# Patient Record
Sex: Male | Born: 1980 | Race: White | Hispanic: No | Marital: Married
Health system: Southern US, Community
[De-identification: ages and names within clinical notes are randomized; demographics above are authoritative.]

---

## 2005-12-27 ENCOUNTER — Emergency Department (HOSPITAL_COMMUNITY): Admission: EM | Admit: 2005-12-27 | Discharge: 2005-12-27 | Payer: Self-pay | Admitting: Family Medicine

## 2010-03-13 ENCOUNTER — Emergency Department (HOSPITAL_COMMUNITY): Admission: EM | Admit: 2010-03-13 | Discharge: 2010-03-13 | Payer: Self-pay | Admitting: Family Medicine

## 2015-10-22 ENCOUNTER — Other Ambulatory Visit: Payer: Self-pay | Admitting: Otolaryngology

## 2015-10-22 DIAGNOSIS — H903 Sensorineural hearing loss, bilateral: Secondary | ICD-10-CM

## 2015-10-22 DIAGNOSIS — H9312 Tinnitus, left ear: Secondary | ICD-10-CM

## 2015-11-01 ENCOUNTER — Ambulatory Visit
Admission: RE | Admit: 2015-11-01 | Discharge: 2015-11-01 | Disposition: A | Discharge status: Home / Self Care | Payer: BLUE CROSS/BLUE SHIELD | Source: Ambulatory Visit | Attending: Otolaryngology | Admitting: Otolaryngology

## 2015-11-01 DIAGNOSIS — H903 Sensorineural hearing loss, bilateral: Secondary | ICD-10-CM

## 2015-11-01 DIAGNOSIS — H9312 Tinnitus, left ear: Secondary | ICD-10-CM

## 2015-11-01 MED ORDER — GADOBENATE DIMEGLUMINE 529 MG/ML IV SOLN: 15.0000 mL | Freq: Once | INTRAVENOUS | Status: DC | PRN

## 2016-04-25 DIAGNOSIS — M25511 Pain in right shoulder: Secondary | ICD-10-CM | POA: Diagnosis not present

## 2016-04-25 DIAGNOSIS — M5412 Radiculopathy, cervical region: Secondary | ICD-10-CM | POA: Diagnosis not present

## 2016-05-03 DIAGNOSIS — M25511 Pain in right shoulder: Secondary | ICD-10-CM | POA: Diagnosis not present

## 2016-05-03 DIAGNOSIS — M5412 Radiculopathy, cervical region: Secondary | ICD-10-CM | POA: Diagnosis not present

## 2016-06-30 DIAGNOSIS — Z23 Encounter for immunization: Secondary | ICD-10-CM | POA: Diagnosis not present

## 2016-07-13 DIAGNOSIS — Z3009 Encounter for other general counseling and advice on contraception: Secondary | ICD-10-CM | POA: Diagnosis not present

## 2016-08-03 DIAGNOSIS — Z302 Encounter for sterilization: Secondary | ICD-10-CM | POA: Diagnosis not present

## 2016-10-06 DIAGNOSIS — Z0000 Encounter for general adult medical examination without abnormal findings: Secondary | ICD-10-CM | POA: Diagnosis not present

## 2016-10-06 DIAGNOSIS — Z13220 Encounter for screening for lipoid disorders: Secondary | ICD-10-CM | POA: Diagnosis not present

## 2017-06-25 DIAGNOSIS — Z23 Encounter for immunization: Secondary | ICD-10-CM | POA: Diagnosis not present

## 2018-02-15 IMAGING — MR MR HEAD WO/W CM
10 of 11 series · 41 of 48 positions shown · IV contrast (multihance)
Comparison: None available.

CLINICAL DATA: Ringing in left ear for 4-6 months. Bilateral
hearing loss.

EXAM:
MRI HEAD WITHOUT AND WITH CONTRAST
TECHNIQUE: Multiplanar, multiecho pulse sequences of the brain and surrounding
structures were obtained without and with intravenous contrast.
CONTRAST:  15 mL MultiHance

[Series 5: T1 · sagittal · 4.0mm · 0.72mm/px · 4 of 26 slices shown (1 of 3)]
[im 1/26]
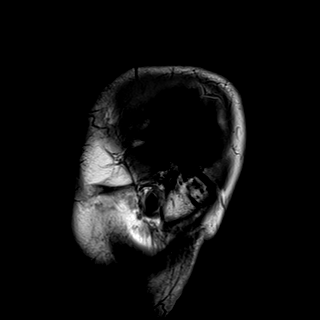
[im 9/26]
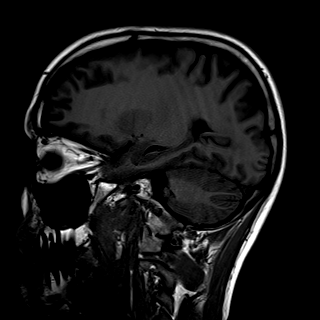
[im 17/26]
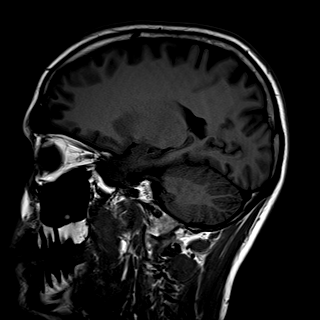
[im 26/26]
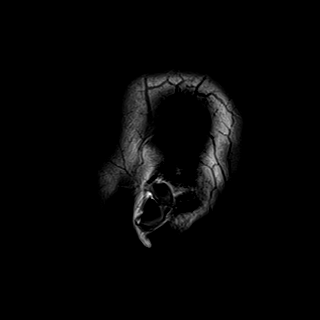

[Series 6: DWI · axial · 3.5mm · 1.44mm/px · z∈[-69,+75]mm · 7 of 66 slices shown (1 of 2)]
[im 1/66]
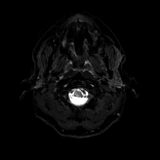
[im 11/66]
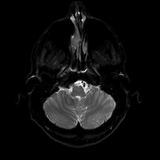
[im 22/66]
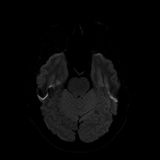
[im 33/66]
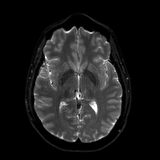
[im 44/66]
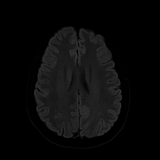
[im 55/66]
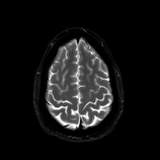
[im 66/66]
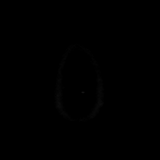

[Series 7: DWI · axial · 3.5mm · 1.44mm/px · z∈[-69,+75]mm · 3 of 32 slices shown (2 of 2)]
[im 1/32]
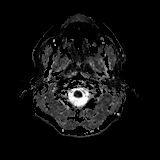
[im 16/32]
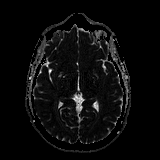
[im 32/32]
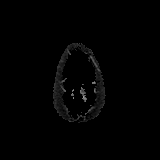

[Series 8: T2 · axial · 4.0mm · 0.36mm/px · z∈[-71,+78]mm · 3 of 30 slices shown]
[im 1/30]
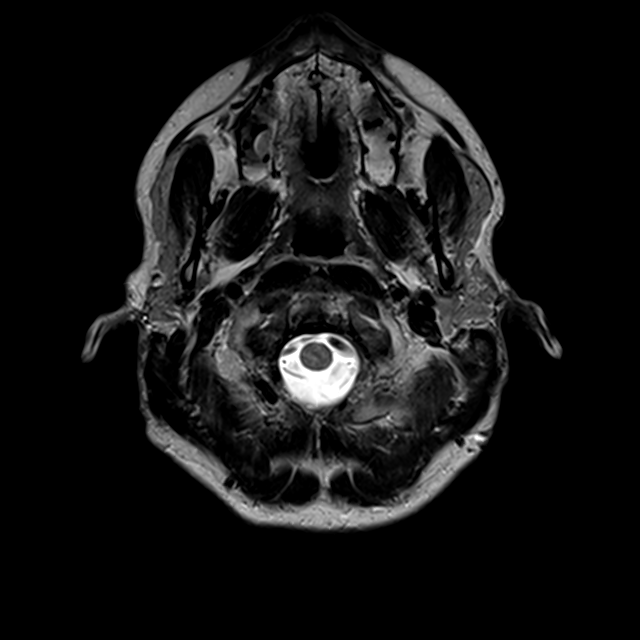
[im 15/30]
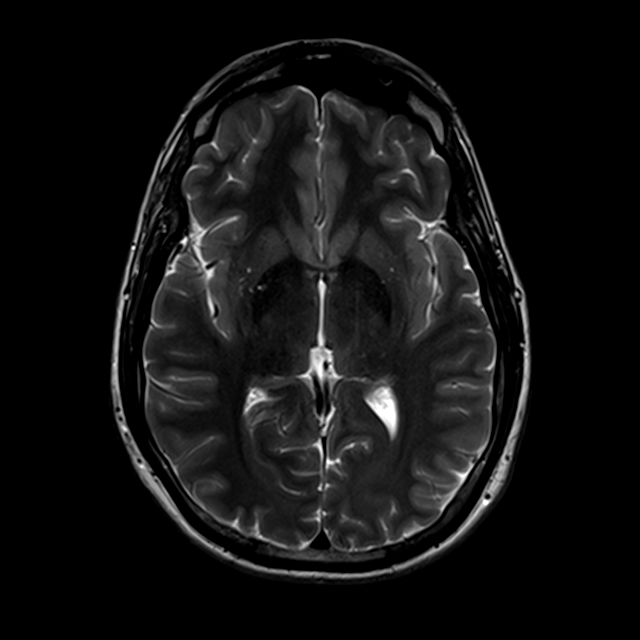
[im 30/30]
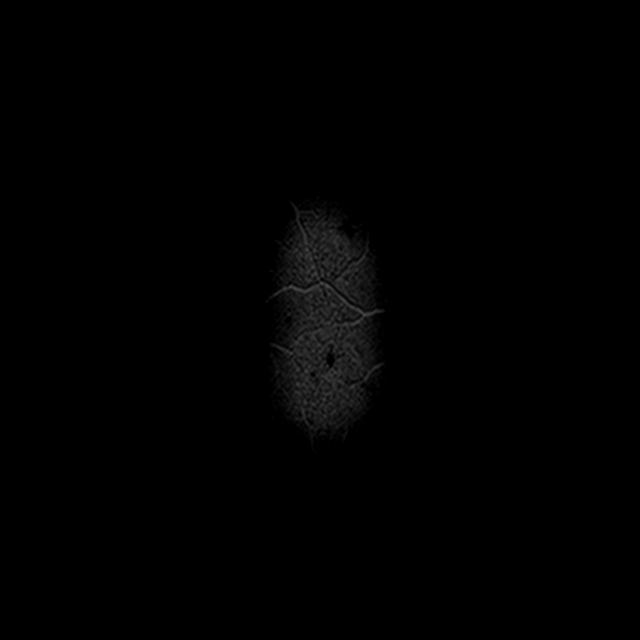

[Series 9: FLAIR · axial · 4.0mm · 0.72mm/px · z∈[-71,+78]mm · 3 of 30 slices shown]
[im 1/30]
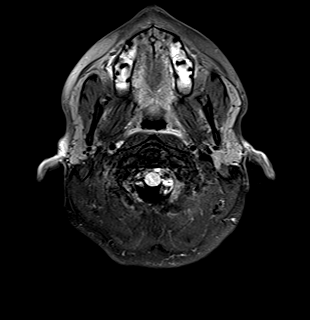
[im 15/30]
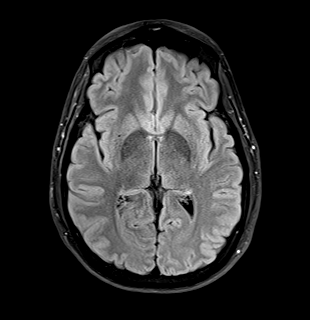
[im 30/30]
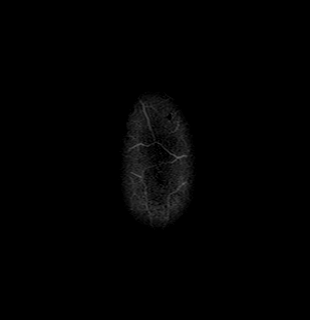

[Series 10: T1 · coronal · 2.5mm · 0.56mm/px · 1 of 11 slices shown (2 of 3)]
[im 1/11]
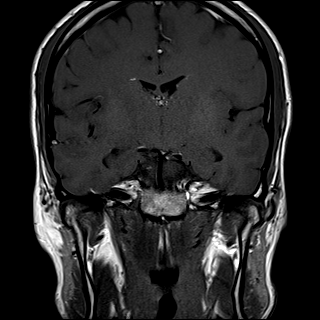

[Series 11: T1 · axial · 2.5mm · 0.59mm/px · 1 of 11 slices shown (3 of 3)]
[im 1/11]
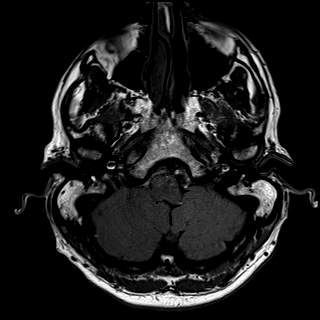

[Series 13: T1 post-contrast · coronal · 2.5mm · 0.56mm/px · 1 of 11 slices shown (1 of 3)]
[im 1/11]
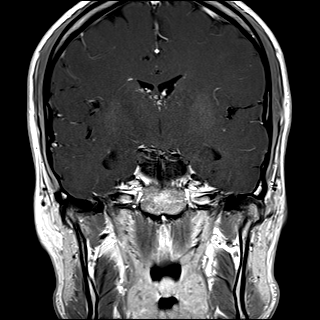

[Series 14: T1 post-contrast · axial · 2.5mm · 0.59mm/px · 1 of 11 slices shown (2 of 3)]
[im 1/11]
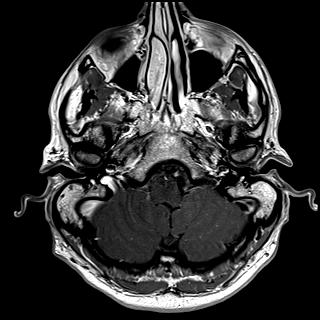

[Series 15: T1 post-contrast · axial · 1.0mm · 0.90mm/px · z∈[-79,+77]mm · 17 of 160 slices shown (3 of 3)]
[im 1/160]
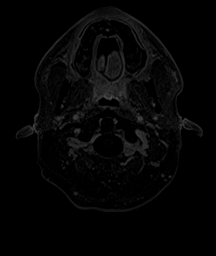
[im 10/160]
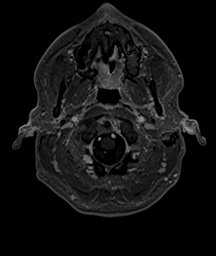
[im 20/160]
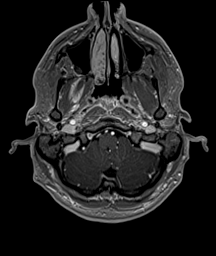
[im 30/160]
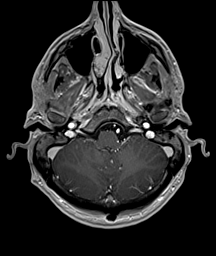
[im 40/160]
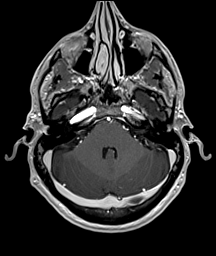
[im 50/160]
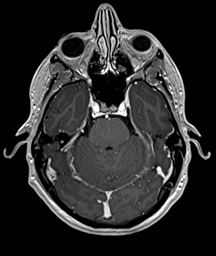
[im 60/160]
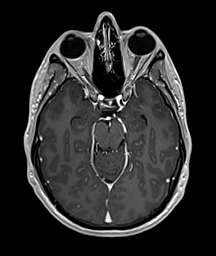
[im 70/160]
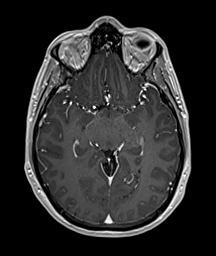
[im 80/160]
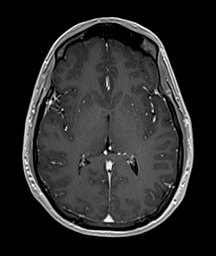
[im 90/160]
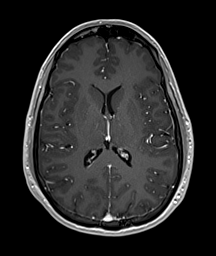
[im 100/160]
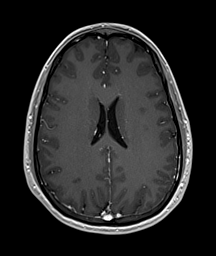
[im 110/160]
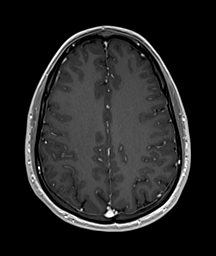
[im 120/160]
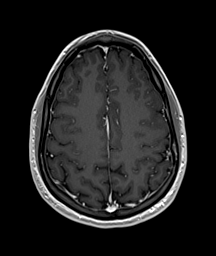
[im 130/160]
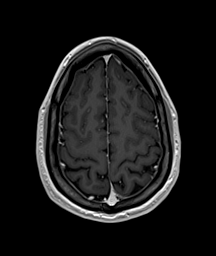
[im 140/160]
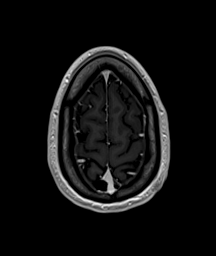
[im 150/160]
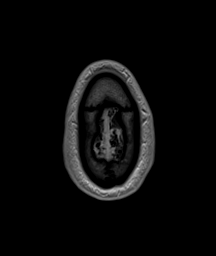
[im 160/160]
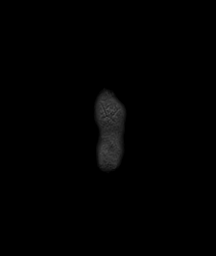

[41 of 48 positions shown; findings below may reference images not displayed]

FINDINGS: No acute infarct, hemorrhage, or mass lesion is present. The
ventricles are of normal size. No significant white matter disease
is present. No significant extraaxial fluid collection is present.

Dedicated imaging of the internal auditory canals demonstrates no
pathologic enhancement. High-resolution steady state T2 weighted
imaging demonstrates a distinct appearance of the seventh and eighth
cranial nerves. The inner ear structures are normally formed.

The postcontrast images through the remainder of the brain are
within normal limits.

The brainstem and cerebellum are within normal limits. Flow is
present in the major intracranial arteries. The globes and orbits
are intact. The paranasal sinuses are clear.
IMPRESSION: 1. No acute or focal lesion to explain the patient's hearing loss or
tinnitus.
2. Negative MRI of the brain.

## 2018-07-16 DIAGNOSIS — Z23 Encounter for immunization: Secondary | ICD-10-CM | POA: Diagnosis not present

## 2018-10-01 DIAGNOSIS — J069 Acute upper respiratory infection, unspecified: Secondary | ICD-10-CM | POA: Diagnosis not present

## 2018-10-01 DIAGNOSIS — J111 Influenza due to unidentified influenza virus with other respiratory manifestations: Secondary | ICD-10-CM | POA: Diagnosis not present

## 2019-11-20 ENCOUNTER — Ambulatory Visit: Payer: Self-pay | Attending: Internal Medicine

## 2019-11-20 DIAGNOSIS — Z23 Encounter for immunization: Secondary | ICD-10-CM

## 2019-11-20 NOTE — Progress Notes (Signed)
   [facility]ov[facility] Vaccination [facility]  Name:  Beth Spackman    MRN: [id number] DOB: [birth year]  11/20/2019  Mr. Reuter was observed post Covid-19 immunization for 15 minutes without incident. He was provided with Vaccine Information Sheet and instruction to access the V-Safe system.   Mr. Dettore was instructed to call 911 with any severe reactions post vaccine: Marland Kitchen Difficulty breathing  . Swelling of face and throat  . A fast heartbeat  . A bad rash all over body  . Dizziness and weakness   Immunizations Administered    Name Date Dose VIS Date Route   Pfizer COVID-19 Vaccine 11/20/2019 12:19 PM 0.3 mL 08/15/2019 Intramuscular   Manufacturer: [employer], [employer]   Lot: [id number]   NDC: [address]-3

## 2019-12-16 ENCOUNTER — Ambulatory Visit: Payer: Self-pay | Attending: Internal Medicine

## 2019-12-16 DIAGNOSIS — Z23 Encounter for immunization: Secondary | ICD-10-CM

## 2019-12-16 NOTE — Progress Notes (Signed)
   Covid-19 Vaccination Clinic  Name:  Travian Kerner    MRN: [id number] DOB: 10-Jan-1981  12/16/2019  Mr. Toth was observed post Covid-19 immunization for 15 minutes without incident. He was provided with Vaccine Information Sheet and instruction to access the V-Safe system.   Mr. Vera was instructed to call 911 with any severe reactions post vaccine: Marland Kitchen Difficulty breathing  . Swelling of face and throat  . A fast heartbeat  . A bad rash all over body  . Dizziness and weakness   Immunizations Administered    Name Date Dose VIS Date Route   Pfizer COVID-19 Vaccine 12/16/2019 12:07 PM 0.3 mL 08/15/2019 Intramuscular   Manufacturer: [employer], [employer]   Lot: [id number]   NDC: [address]-5

## 2020-09-08 DIAGNOSIS — Z23 Encounter for immunization: Secondary | ICD-10-CM | POA: Diagnosis not present

## 2020-09-08 DIAGNOSIS — Z13220 Encounter for screening for lipoid disorders: Secondary | ICD-10-CM | POA: Diagnosis not present

## 2020-09-08 DIAGNOSIS — Z0000 Encounter for general adult medical examination without abnormal findings: Secondary | ICD-10-CM | POA: Diagnosis not present

## 2021-02-12 DIAGNOSIS — B009 Herpesviral infection, unspecified: Secondary | ICD-10-CM | POA: Diagnosis not present

## 2021-05-16 DIAGNOSIS — R21 Rash and other nonspecific skin eruption: Secondary | ICD-10-CM | POA: Diagnosis not present

## 2021-06-04 DIAGNOSIS — J019 Acute sinusitis, unspecified: Secondary | ICD-10-CM | POA: Diagnosis not present

## 2022-07-04 DIAGNOSIS — Z23 Encounter for immunization: Secondary | ICD-10-CM | POA: Diagnosis not present

## 2022-09-22 DIAGNOSIS — R079 Chest pain, unspecified: Secondary | ICD-10-CM | POA: Diagnosis not present

## 2022-09-22 DIAGNOSIS — Z0000 Encounter for general adult medical examination without abnormal findings: Secondary | ICD-10-CM | POA: Diagnosis not present

## 2022-09-22 DIAGNOSIS — E7800 Pure hypercholesterolemia, unspecified: Secondary | ICD-10-CM | POA: Diagnosis not present
